# Patient Record
Sex: Female | Born: 1975 | Race: White | Hispanic: No | Marital: Married | Smoking: Never smoker
Health system: Southern US, Community
[De-identification: ages and names within clinical notes are randomized; demographics above are authoritative.]

## PROBLEM LIST (undated history)

## (undated) DIAGNOSIS — R12 Heartburn: Secondary | ICD-10-CM

## (undated) DIAGNOSIS — O26899 Other specified pregnancy related conditions, unspecified trimester: Secondary | ICD-10-CM

## (undated) HISTORY — PX: WISDOM TOOTH EXTRACTION: SHX21

## (undated) HISTORY — PX: ORIF ACETABULAR FRACTURE: SHX5029

## (undated) HISTORY — PX: OTHER SURGICAL HISTORY: SHX169

---

## 2008-03-21 ENCOUNTER — Other Ambulatory Visit: Admission: RE | Admit: 2008-03-21 | Discharge: 2008-03-21 | Payer: Self-pay | Admitting: Family Medicine

## 2009-03-13 ENCOUNTER — Ambulatory Visit: Payer: Self-pay | Admitting: Vascular Surgery

## 2009-03-13 ENCOUNTER — Ambulatory Visit: Admission: RE | Admit: 2009-03-13 | Discharge: 2009-03-13 | Payer: Self-pay | Admitting: Family Medicine

## 2009-03-13 ENCOUNTER — Encounter (INDEPENDENT_AMBULATORY_CARE_PROVIDER_SITE_OTHER): Payer: Self-pay | Admitting: Family Medicine

## 2009-03-28 ENCOUNTER — Ambulatory Visit: Payer: Self-pay | Admitting: Sports Medicine

## 2009-03-28 DIAGNOSIS — M629 Disorder of muscle, unspecified: Secondary | ICD-10-CM

## 2009-03-28 DIAGNOSIS — M216X9 Other acquired deformities of unspecified foot: Secondary | ICD-10-CM

## 2009-03-28 DIAGNOSIS — M25559 Pain in unspecified hip: Secondary | ICD-10-CM

## 2009-03-28 DIAGNOSIS — M76899 Other specified enthesopathies of unspecified lower limb, excluding foot: Secondary | ICD-10-CM

## 2009-12-15 ENCOUNTER — Emergency Department (HOSPITAL_COMMUNITY): Admission: EM | Admit: 2009-12-15 | Discharge: 2009-12-15 | Payer: Self-pay | Admitting: Family Medicine

## 2010-04-10 ENCOUNTER — Ambulatory Visit: Payer: Self-pay | Admitting: Family Medicine

## 2010-04-10 DIAGNOSIS — M722 Plantar fascial fibromatosis: Secondary | ICD-10-CM

## 2010-06-15 ENCOUNTER — Other Ambulatory Visit: Admission: RE | Admit: 2010-06-15 | Discharge: 2010-06-15 | Payer: Self-pay | Admitting: Family Medicine

## 2010-12-08 NOTE — Assessment & Plan Note (Signed)
Summary: HEEL PAIN,RUNNING MARATHON SAT,MC   Vital Signs:  Patient profile:   35 year old female Height:      63 inches Weight:      126 pounds BMI:     22.40 BP sitting:   128 / 70  Vitals Entered By: Lillia Pauls CMA (April 10, 2010 9:12 AM)  History of Present Illness: Several days of increasing Right heel pain. Leaves this pm to run marathon tomorrow.  Got new shoes of a different type for this race. had done well with them  until last week or so when she developed pain in right plantar heel.Worse first step of am, sore, 4-6/10. Better with ice. Wants an injection  as she has trained hard for this Marathon and feels she will likely not be able to run without treatment.  Has had no prior problems with plantar fasciitis of either foot. New shoe style her only change  Allergies (verified): 1)  ! * Decadron  Review of Systems       see hpi  Physical Exam  Msk:  Right plantar fascia tender at origin and palp[ation here reproduces her pain. Foot is otherwise non tender. Slight pes planus B. Additional Exam:  Patient given informed consent for injection. Discussed possible complications of infection, bleeding or skin atrophy at site of injection. Possible side effect of avascular necrosis (focal area of bone death) due to steroid use.Appropriate verbal time out taken Are cleaned and prepped in usual sterile fashion. A -1/2--- cc kennalog plus ---1-cc 1% lidocaine without epinephrine was injected into the-area above the right plantar fascia using the lateral approach--. Patient tolerated procedure well with no complications.    Impression & Recommendations:  Problem # 1:  PLANTAR FASCIITIS, RIGHT (ICD-728.71)  acute--probably due to shoe change while traininng for marathon. We discussed options--I recommend return to her previous shoe for the race. Pros and cons of injection therapy at this time (right before race) vs conservative therapy were discussed at length. Ultimately she chose  injection. RTC as needed, especially if she continues to have problems, we could consider orthotics.  Orders: Joint Aspirate / Injection, Intermediate (99371) Kenalog 10 mg inj (J3301)

## 2012-07-25 ENCOUNTER — Other Ambulatory Visit: Payer: Self-pay | Admitting: Family Medicine

## 2012-07-25 DIAGNOSIS — N6452 Nipple discharge: Secondary | ICD-10-CM

## 2012-07-28 ENCOUNTER — Ambulatory Visit
Admission: RE | Admit: 2012-07-28 | Discharge: 2012-07-28 | Disposition: A | Discharge status: Home / Self Care | Payer: 59 | Source: Ambulatory Visit | Attending: Family Medicine | Admitting: Family Medicine

## 2012-07-28 DIAGNOSIS — N6452 Nipple discharge: Secondary | ICD-10-CM

## 2012-09-08 ENCOUNTER — Emergency Department (HOSPITAL_COMMUNITY)
Admission: EM | Admit: 2012-09-08 | Discharge: 2012-09-08 | Disposition: A | Discharge status: Nursing Home (Includes ICF) | Payer: 59 | Source: Home / Self Care | Attending: Emergency Medicine | Admitting: Emergency Medicine

## 2012-09-08 ENCOUNTER — Emergency Department (HOSPITAL_COMMUNITY)
Admission: EM | Admit: 2012-09-08 | Discharge: 2012-09-09 | Disposition: A | Discharge status: Home / Self Care | Payer: 59 | Attending: Emergency Medicine | Admitting: Emergency Medicine

## 2012-09-08 ENCOUNTER — Encounter (HOSPITAL_COMMUNITY): Payer: Self-pay | Admitting: Emergency Medicine

## 2012-09-08 ENCOUNTER — Encounter (HOSPITAL_COMMUNITY): Payer: Self-pay

## 2012-09-08 ENCOUNTER — Emergency Department (HOSPITAL_COMMUNITY): Payer: 59

## 2012-09-08 DIAGNOSIS — R109 Unspecified abdominal pain: Secondary | ICD-10-CM | POA: Insufficient documentation

## 2012-09-08 DIAGNOSIS — K3580 Unspecified acute appendicitis: Secondary | ICD-10-CM

## 2012-09-08 LAB — HEPATIC FUNCTION PANEL
ALT: 25 U/L (ref 0–35)
AST: 28 U/L (ref 0–37)
Albumin: 3.8 g/dL (ref 3.5–5.2)
Alkaline Phosphatase: 53 U/L (ref 39–117)
Bilirubin, Direct: 0.1 mg/dL (ref 0.0–0.3)
Indirect Bilirubin: 0.4 mg/dL (ref 0.3–0.9)
Total Bilirubin: 0.5 mg/dL (ref 0.3–1.2)
Total Protein: 6.3 g/dL (ref 6.0–8.3)

## 2012-09-08 LAB — CBC WITH DIFFERENTIAL/PLATELET
Basophils Absolute: 0 K/uL (ref 0.0–0.1)
Basophils Relative: 0 % (ref 0–1)
Eosinophils Absolute: 0.2 K/uL (ref 0.0–0.7)
Eosinophils Relative: 3 % (ref 0–5)
HCT: 35.9 % — ABNORMAL LOW (ref 36.0–46.0)
Hemoglobin: 12.5 g/dL (ref 12.0–15.0)
Lymphocytes Relative: 38 % (ref 12–46)
Lymphs Abs: 2.7 K/uL (ref 0.7–4.0)
MCH: 29 pg (ref 26.0–34.0)
MCHC: 34.8 g/dL (ref 30.0–36.0)
MCV: 83.3 fL (ref 78.0–100.0)
Monocytes Absolute: 0.7 K/uL (ref 0.1–1.0)
Monocytes Relative: 9 % (ref 3–12)
Neutro Abs: 3.5 K/uL (ref 1.7–7.7)
Neutrophils Relative %: 50 % (ref 43–77)
Platelets: 187 K/uL (ref 150–400)
RBC: 4.31 MIL/uL (ref 3.87–5.11)
RDW: 12.6 % (ref 11.5–15.5)
WBC: 7.1 K/uL (ref 4.0–10.5)

## 2012-09-08 LAB — BASIC METABOLIC PANEL
BUN: 18 mg/dL (ref 6–23)
CO2: 21 mEq/L (ref 19–32)
Calcium: 8.4 mg/dL (ref 8.4–10.5)
Creatinine, Ser: 0.8 mg/dL (ref 0.50–1.10)
GFR calc non Af Amer: >90 mL/min (ref >90)
Glucose, Bld: 96 mg/dL (ref 70–99)
Sodium: 138 mEq/L (ref 135–145)

## 2012-09-08 LAB — POCT URINALYSIS DIP (DEVICE)
Hgb urine dipstick: NEGATIVE
Nitrite: NEGATIVE
Protein, ur: NEGATIVE mg/dL
Specific Gravity, Urine: 1.015 (ref 1.005–1.030)
Urobilinogen, UA: 0.2 mg/dL (ref 0.0–1.0)

## 2012-09-08 LAB — LIPASE, BLOOD: Lipase: 33 U/L (ref 11–59)

## 2012-09-08 LAB — WET PREP, GENITAL: Trich, Wet Prep: NONE SEEN

## 2012-09-08 MED ORDER — HYDROMORPHONE HCL PF 1 MG/ML IJ SOLN
1.0000 mg | Freq: Once | INTRAMUSCULAR | Status: AC
  Administered 2012-09-08: 1 mg via INTRAVENOUS
  Filled 2012-09-08: qty 1

## 2012-09-08 MED ORDER — ONDANSETRON HCL 4 MG/2ML IJ SOLN
INTRAMUSCULAR | Status: AC
  Filled 2012-09-08: qty 2

## 2012-09-08 MED ORDER — SODIUM CHLORIDE 0.9 % IV SOLN
INTRAVENOUS | Status: DC
  Administered 2012-09-08: 22:00:00 via INTRAVENOUS

## 2012-09-08 MED ORDER — HYDROMORPHONE HCL PF 1 MG/ML IJ SOLN
INTRAMUSCULAR | Status: AC
  Filled 2012-09-08: qty 1

## 2012-09-08 MED ORDER — SODIUM CHLORIDE 0.9 % IV SOLN
INTRAVENOUS | Status: DC
  Administered 2012-09-08: 20:00:00 via INTRAVENOUS

## 2012-09-08 MED ORDER — HYDROMORPHONE HCL PF 1 MG/ML IJ SOLN
1.0000 mg | Freq: Once | INTRAMUSCULAR | Status: AC
  Administered 2012-09-08: 1 mg via INTRAVENOUS

## 2012-09-08 MED ORDER — SODIUM CHLORIDE 0.9 % IV BOLUS (SEPSIS)
500.0000 mL | Freq: Once | INTRAVENOUS | Status: AC
  Administered 2012-09-09: 500 mL via INTRAVENOUS

## 2012-09-08 MED ORDER — ONDANSETRON HCL 4 MG/2ML IJ SOLN
4.0000 mg | Freq: Once | INTRAMUSCULAR | Status: AC
  Administered 2012-09-08: 4 mg via INTRAVENOUS

## 2012-09-08 NOTE — ED Provider Notes (Signed)
History     CSN: [id number]  Arrival date & time 09/08/12  2027   First MD Initiated Contact with Patient 09/08/12 2049      Chief Complaint  Patient presents with  . 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 Patient reports while walking today she developed acute onset of pain to her mid abdomen. Describe pain as a sharp and throbbing sensation that gets progressively severe throughout the day. She took ibuprofen 800mg  without relief.  Onset is acute, persistent, moderate in severity, radiates to the right lower abdomen She felt nauseated without vomiting or diarrhea. Denies fever, chills, headache, chest pain, short of breath, cough, back pain, urinary symptoms, or rash. Her last menstrual period was a month ago and she is having some spotting. However, patient does not menstrual cramp she normally does not have menstrual cramp. She denies any vaginal discharge or dysuria. Patient was initially seen at the urgent care clinic, was sent to ER for further evaluation for possible appendicitis. Patient reports she is feeling much better after receiving 1 mg of Dilaudid and Zofran. No prior history of abdominal surgery.  No past medical history on file.  No past surgical history on file.  No family history on file.  History  Substance Use Topics  . Smoking status: Never Smoker   . Smokeless tobacco: Not on file  . Alcohol Use: Yes    OB History    Grav Para Term Preterm Abortions TAB SAB Ect Mult Living                  Review of Systems  All other systems reviewed and are negative.    Allergies  Review of patient's allergies indicates no known allergies.  Home Medications  No current outpatient prescriptions on file.  BP 154/89  Pulse 46  Temp 98.2 F (36.8 C) (Oral)  Resp 20  SpO2 100%  LMP  09/08/2012  Physical Exam  Nursing note and vitals reviewed. Constitutional: She is oriented to person, place, and time. She appears well-developed and well-nourished. No distress.       Awake, alert, nontoxic appearance  HENT:  Head: Atraumatic.  Eyes: Conjunctivae normal are normal. Right eye exhibits no discharge. Left eye exhibits no discharge.  Neck: Neck supple.  Cardiovascular: Normal rate and regular rhythm.   Pulmonary/Chest: Effort normal. No respiratory distress. She exhibits no tenderness.  Abdominal: Soft. There is tenderness (Tenderness to periumbilical region on palpation without guarding or rebound tenderness. No CVA tenderness. No hernia noted. No overlying skin changes. Negative psoas or obturator sign.). There is no rebound and no guarding.  Musculoskeletal: She exhibits no edema and no tenderness.       ROM appears intact, no obvious focal weakness  Neurological: She is alert and oriented to person, place, and time.       Mental status and motor strength appears intact  Skin: No rash noted.  Psychiatric: She has a normal mood and affect.    ED Course  Procedures (including critical care time)  Results for orders placed during the hospital encounter of 09/08/12  CBC WITH DIFFERENTIAL      Component Value Range   WBC 7.1  4.0 - 10.5 K/uL   RBC 4.31  3.87 - 5.11 MIL/uL   Hemoglobin 12.5  12.0 - 15.0 g/dL   HCT 40.9 (*) 81.1 - 91.4 %   MCV 83.3  78.0 -  100.0 fL   MCH 29.0  26.0 - 34.0 pg   MCHC 34.8  30.0 - 36.0 g/dL   RDW 16.1  09.6 - 04.5 %   Platelets 187  150 - 400 K/uL   Neutrophils Relative 50  43 - 77 %   Neutro Abs 3.5  1.7 - 7.7 K/uL   Lymphocytes Relative 38  12 - 46 %   Lymphs Abs 2.7  0.7 - 4.0 K/uL   Monocytes Relative 9  3 - 12 %   Monocytes Absolute 0.7  0.1 - 1.0 K/uL   Eosinophils Relative 3  0 - 5 %   Eosinophils Absolute 0.2  0.0 - 0.7 K/uL   Basophils Relative 0  0 - 1 %   Basophils Absolute 0.0  0.0 - 0.1 K/uL  BASIC METABOLIC PANEL       Component Value Range   Sodium 138  135 - 145 mEq/L   Potassium 3.6  3.5 - 5.1 mEq/L   Chloride 106  96 - 112 mEq/L   CO2 21  19 - 32 mEq/L   Glucose, Bld 96  70 - 99 mg/dL   BUN 18  6 - 23 mg/dL   Creatinine, Ser 4.09  0.50 - 1.10 mg/dL   Calcium 8.4  8.4 - 81.1 mg/dL   GFR calc non Af Amer >90  >90 mL/min   GFR calc Af Amer >90  >90 mL/min  HEPATIC FUNCTION PANEL      Component Value Range   Total Protein 6.3  6.0 - 8.3 g/dL   Albumin 3.8  3.5 - 5.2 g/dL   AST 28  0 - 37 U/L   ALT 25  0 - 35 U/L   Alkaline Phosphatase 53  39 - 117 U/L   Total Bilirubin 0.5  0.3 - 1.2 mg/dL   Bilirubin, Direct 0.1  0.0 - 0.3 mg/dL   Indirect Bilirubin 0.4  0.3 - 0.9 mg/dL  LIPASE, BLOOD      Component Value Range   Lipase 33  11 - 59 U/L   Ct Abdomen Pelvis W Contrast  09/09/2012  *RADIOLOGY REPORT*  Clinical Data: Right lower quadrant abdominal pain.  Mid abdominal pain.  Nausea.  CT ABDOMEN AND PELVIS WITH CONTRAST  Technique:  Multidetector CT imaging of the abdomen and pelvis was performed following the standard protocol during bolus administration of intravenous contrast.  Contrast: 80mL OMNIPAQUE IOHEXOL 300 MG/ML  SOLN  Comparison: None.  Findings: The lung bases are clear.  The liver, spleen, gallbladder, pancreas, adrenal glands, kidneys, abdominal aorta, and retroperitoneal lymph nodes are unremarkable. The stomach, small bowel, and colon are not abnormally distended. Diffusely stool filled colon.  No free air or free fluid in the abdomen.  Pelvis:  The uterus and ovaries are not abnormally enlarged. Bladder wall is not thickened.  No free or loculated pelvic fluid collections.  The appendix is not identified.  No diverticulitis. Normal alignment of the lumbar vertebrae.  IMPRESSION: No acute process demonstrated in the abdomen or pelvis.   Original Report Authenticated By: Burman Nieves, M.D.     1. Abdominal pain  MDM  Pt with acute onset of periumbilical tenderness and finding  suggestive of appendicitis. Pt is minimally tender to her periumbilical region however she has been given pain medication.  Pt has a [facility] exam [facility] today but denies any significant discomfort with pelvic exam.  Her wet prep does shows moderate WBC.  Pt acknowledge she normally  has low heart rate.  Will check CBC/BMP and will consider CT scan if she has elevated WBC.  Pt current in NAD.     10:06 PM Pt prefers CT scan to r/o appy.  My attending has evaluated pt and agrees.  CT scan ordered.  Will move to CDU, report given to CDU PA by my attending.     12:48 AM Patient currently rating her pain as a 4/10. Abdominal CT scans does not show evidence of any acute finding, however and appendix was  not visualized. I discussed the time of my attending. Plan to have patient return in 12 hours for serial examination. Patient voiced understanding and agrees with plan. Patient does have a primary care Dr., Dr. Yehuda Mao.  Recommend return sooner if the pain worsened. I offered pain medication, patient declined. Patient requests to be discharged.    BP 111/77  Pulse 41  Temp 98.2 F (36.8 C) (Oral)  Resp 18  SpO2 93%  LMP 09/08/2012  I have reviewed nursing notes and vital signs. I personally reviewed the imaging tests through PACS system  I reviewed available ER/hospitalization records thought the EMR   Fayrene Helper, [place] 09/09/12 1610

## 2012-09-08 NOTE — ED Notes (Signed)
Spoke with [facility] to give report about patient for transfer.

## 2012-09-08 NOTE — ED Notes (Signed)
Pt presents from urgent care with RLQ abd pain.  Pt states started this morning without getting better.  Went to urgent care and was seen there and was transferred here.  Pt was given dilaudid 1mg  and Zofran 4mg .  Pt a/o at this time.

## 2012-09-08 NOTE — ED Provider Notes (Signed)
Medical screening examination/treatment/procedure(s) were conducted as a shared visit with non-physician practitioner(s) and myself.  I personally evaluated the patient during the encounter.  This 36 year old female has almost 12 hours of gradual onset. Local pain now radiating somewhat towards the right lower quadrant with nausea without vomiting, without vaginal discharge, without pelvic pain or tenderness on her examination at the urgent care Center prior to arrival to the ED today, she has mild periumbilical and right lower quadrant tenderness now without rebound, she prefers CT tonight after options discussed, plan move to CDU. [id number]  Hurman Horn, MD 09/10/12 [phone]

## 2012-09-08 NOTE — ED Notes (Signed)
Reports abd pain which started today.    Tried Motrin 800 mg and heat but no relief.   Reports spotting in the vagina

## 2012-09-08 NOTE — ED Provider Notes (Signed)
No chief complaint on file.   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 It became more severe around 3 and now is rated 10 over 10 in intensity. The pain has doubled her over and has caused nausea but no vomiting. She denies any fever or chills. No constipation, diarrhea, blood in stool. No urinary symptoms. No GYN complaints. She had a little bit of spotting today but her menses are due to come on today. She has been trying to get pregnant. She has no other pregnancy symptoms. She has had no prior history of similar pains in the past. She still has her appendix.  Review of Systems:  Other than noted above, the patient denies any of the following symptoms: Constitutional:  No fever, chills, fatigue, weight loss or anorexia. Lungs:  No cough or shortness of breath. Heart:  No chest pain, palpitations, syncope or edema.  No cardiac history. Abdomen:  No nausea, vomiting, hematememesis, melena, diarrhea, or hematochezia. GU:  No dysuria, frequency, urgency, or hematuria. Gyn:  No vaginal discharge, itching, abnormal bleeding, dyspareunia, or pelvic pain.  PMFSH:  Past medical history, family history, social history, meds, and allergies were reviewed along with nurse's notes.  No prior abdominal surgeries, past history of GI problems, STDs or GYN problems.  No history of aspirin or NSAID use.  No excessive alcohol intake.  Physical Exam:   Vital signs:  BP 163/93  Pulse 50  Temp 97.8 F (36.6 C) (Oral)  Resp 16  SpO2 100% Gen:  Alert, oriented, in severe distress due to abdominal pain. Lungs:  Breath sounds clear and equal bilaterally.  No wheezes, rales or rhonchi. Heart:  Regular rhythm.  No gallops or murmers.   Abdomen:   Flat and nondistended. She has marked at tenderness to palpation in the periumbilical area, right flank, and right lower cautery and with guarding and  rebound. Bowel sounds are present but are diminished. There no masses. Pelvic:  Normal external genitalia, there is a small amount of blood in the vaginal vault, vaginal and cervical mucosa were otherwise normal. No cervical motion tenderness. Uterus is mid position, normal in size and shape, no adnexal masses or tenderness. Skin:  Clear, warm and dry.  No rash.  Labs:   Results for orders placed during the hospital encounter of 09/08/12  POCT URINALYSIS DIP (DEVICE)      Component Value Range   Glucose, UA NEGATIVE  NEGATIVE mg/dL   Bilirubin Urine NEGATIVE  NEGATIVE   Ketones, ur NEGATIVE  NEGATIVE mg/dL   Specific Gravity, Urine 1.015  1.005 - 1.030   Hgb urine dipstick NEGATIVE  NEGATIVE   pH 8.5 (*) 5.0 - 8.0   Protein, ur NEGATIVE  NEGATIVE mg/dL   Urobilinogen, UA 0.2  0.0 - 1.0 mg/dL   Nitrite NEGATIVE  NEGATIVE   Leukocytes, UA NEGATIVE  NEGATIVE  POCT PREGNANCY, URINE      Component Value Range   Preg Test, Ur NEGATIVE  NEGATIVE     Course in Urgent Care Center:   Since she was in such severe pain, she was given Dilaudid 1 mg IV and Zofran 4 mg IV. Normal saline was started at 50 mL per hour.  Assessment:  The encounter diagnosis was Acute appendicitis.  Plan:   1.  The following meds were prescribed:   New Prescriptions   No medications on file  2.  The patient was transferred to the emergency department via [facility] in stable condition.   Reuben Likes, MD 09/08/12 2000

## 2012-09-09 ENCOUNTER — Encounter (HOSPITAL_COMMUNITY): Payer: Self-pay | Admitting: Radiology

## 2012-09-09 LAB — GC/CHLAMYDIA PROBE AMP, GENITAL: GC Probe Amp, Genital: NEGATIVE

## 2012-09-09 MED ORDER — IOHEXOL 300 MG/ML  SOLN
80.0000 mL | Freq: Once | INTRAMUSCULAR | Status: AC | PRN
  Administered 2012-09-09: 80 mL via INTRAVENOUS

## 2012-09-09 MED ORDER — OXYCODONE-ACETAMINOPHEN 5-325 MG PO TABS: 1.0000 | ORAL_TABLET | Freq: Four times a day (QID) | ORAL | Status: DC | PRN

## 2012-09-09 MED ORDER — ONDANSETRON HCL 4 MG PO TABS: 4.0000 mg | ORAL_TABLET | Freq: Four times a day (QID) | ORAL | Status: DC

## 2012-09-09 NOTE — ED Notes (Signed)
Pt for discharge.Vital signs stable and GCS 15 

## 2012-09-10 NOTE — ED Provider Notes (Signed)
Medical screening examination/treatment/procedure(s) were conducted as a shared visit with non-physician practitioner(s) and myself.  I personally evaluated the patient during the encounter  Hurman Horn, MD 09/10/12 1649

## 2012-09-11 NOTE — ED Notes (Signed)
GC,chlamydia negative; wet prep shows clue cells TNTC and few WBC's; discussed w Dr Lorenz Coaster, no need for further tx

## 2013-02-19 ENCOUNTER — Other Ambulatory Visit (HOSPITAL_COMMUNITY): Payer: Self-pay | Admitting: Obstetrics and Gynecology

## 2013-02-19 DIAGNOSIS — Z3149 Encounter for other procreative investigation and testing: Secondary | ICD-10-CM

## 2013-02-28 ENCOUNTER — Ambulatory Visit (HOSPITAL_COMMUNITY)
Admission: RE | Admit: 2013-02-28 | Discharge: 2013-02-28 | Disposition: A | Discharge status: Home / Self Care | Payer: 59 | Source: Ambulatory Visit | Attending: Obstetrics and Gynecology | Admitting: Obstetrics and Gynecology

## 2013-02-28 DIAGNOSIS — Z3149 Encounter for other procreative investigation and testing: Secondary | ICD-10-CM

## 2013-02-28 DIAGNOSIS — N979 Female infertility, unspecified: Secondary | ICD-10-CM | POA: Insufficient documentation

## 2013-02-28 MED ORDER — IOHEXOL 300 MG/ML  SOLN
20.0000 mL | Freq: Once | INTRAMUSCULAR | Status: AC | PRN
  Administered 2013-02-28: 8 mL

## 2013-12-07 LAB — OB RESULTS CONSOLE ABO/RH: RH Type: POSITIVE

## 2013-12-07 LAB — OB RESULTS CONSOLE HEPATITIS B SURFACE ANTIGEN: HEP B S AG: NEGATIVE

## 2013-12-07 LAB — OB RESULTS CONSOLE RPR: RPR: NONREACTIVE

## 2013-12-07 LAB — OB RESULTS CONSOLE HIV ANTIBODY (ROUTINE TESTING): HIV: NONREACTIVE

## 2014-02-21 ENCOUNTER — Other Ambulatory Visit: Payer: Self-pay | Admitting: Obstetrics and Gynecology

## 2014-02-21 MED ORDER — HYDROXYPROGESTERONE CAPROATE 250 MG/ML IM OIL: 250.0000 mg | TOPICAL_OIL | Freq: Once | INTRAMUSCULAR | Status: DC

## 2014-02-22 MED ORDER — HYDROXYPROGESTERONE CAPROATE 250 MG/ML IM OIL: 250.0000 mg | TOPICAL_OIL | Freq: Once | INTRAMUSCULAR | Status: DC

## 2014-03-15 MED ORDER — HYDROXYPROGESTERONE CAPROATE 250 MG/ML IM OIL: 250.0000 mg | TOPICAL_OIL | Freq: Once | INTRAMUSCULAR | Status: DC

## 2014-06-26 IMAGING — CT CT ABD-PELV W/ CM
2 of 4 series · 14 of 32 positions shown, 19 images · IV contrast (omnipaque)
Comparison: None.

CLINICAL DATA: Right lower quadrant abdominal pain.  Mid abdominal
pain.  Nausea.

CT ABDOMEN AND PELVIS WITH CONTRAST
TECHNIQUE: Multidetector CT imaging of the abdomen and pelvis was
performed following the standard protocol during bolus
administration of intravenous contrast.
Contrast: 80mL OMNIPAQUE IOHEXOL 300 MG/ML  SOLN

[Series 2: routine abdomen · axial · 0.70mm/px · z∈[-375,-80]mm · 6 of 83 slices shown, 11 images]
[im 12/83  soft-tissue]
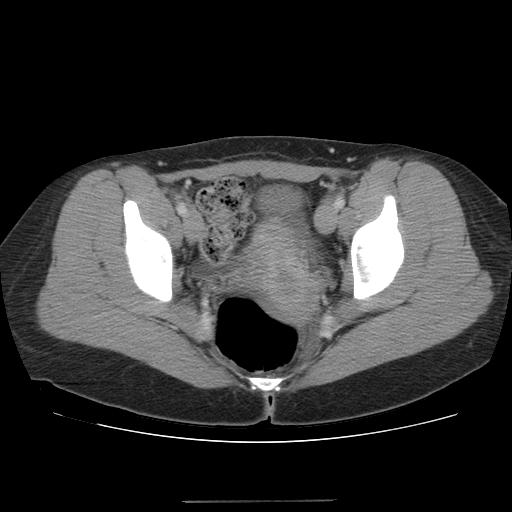
[im 12/83  bone]
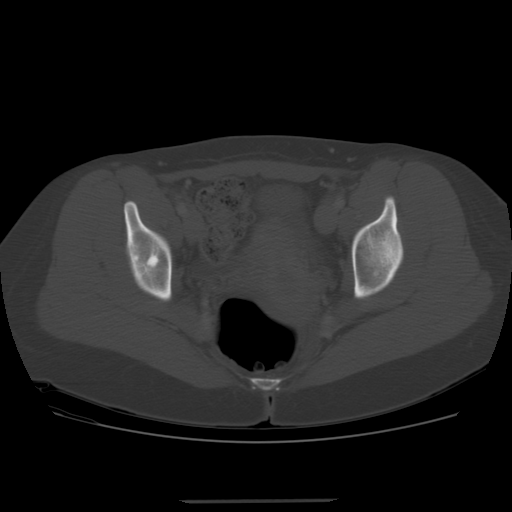
[im 24/83  soft-tissue]
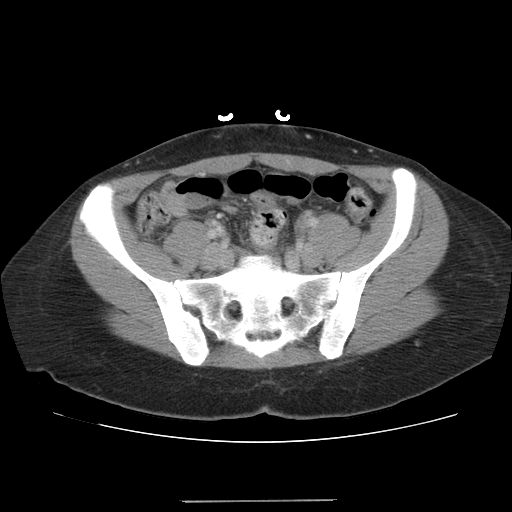
[im 36/83  soft-tissue]
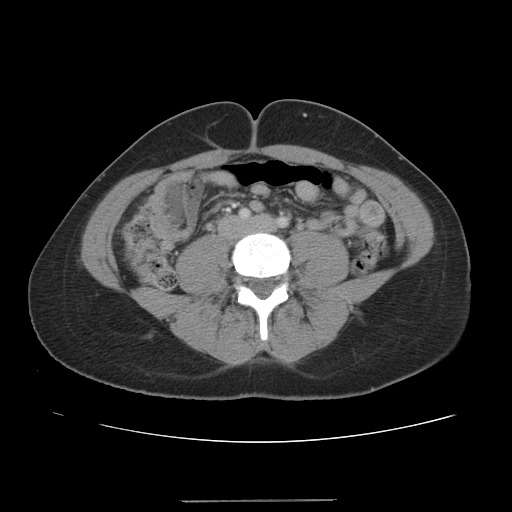
[im 36/83  lung]
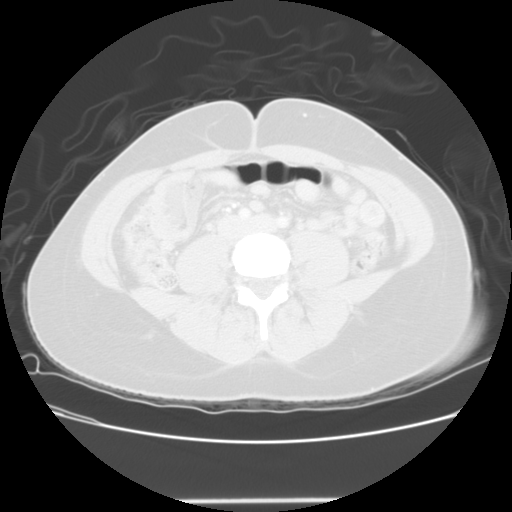
[im 47/83  soft-tissue]
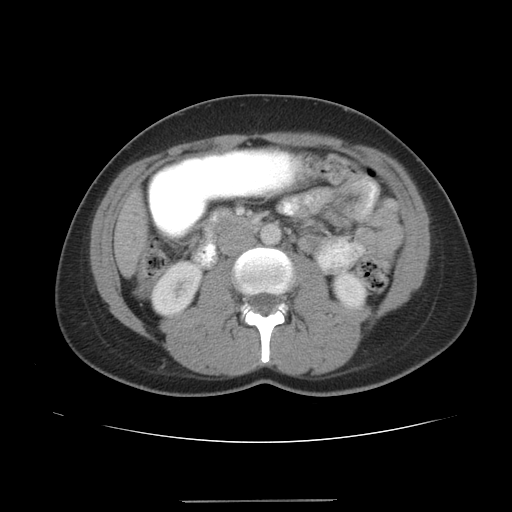
[im 47/83  lung]
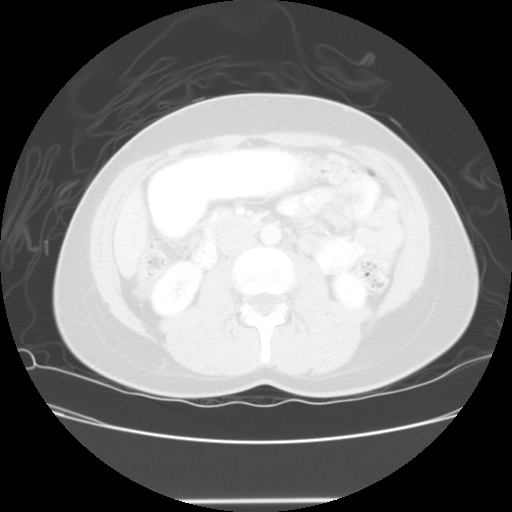
[im 59/83  soft-tissue]
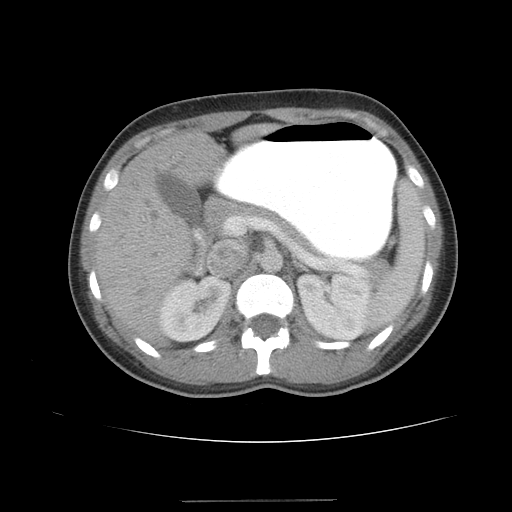
[im 59/83  lung]
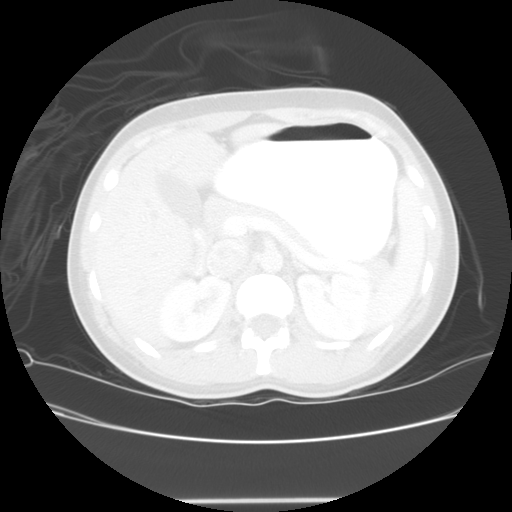
[im 71/83  soft-tissue]
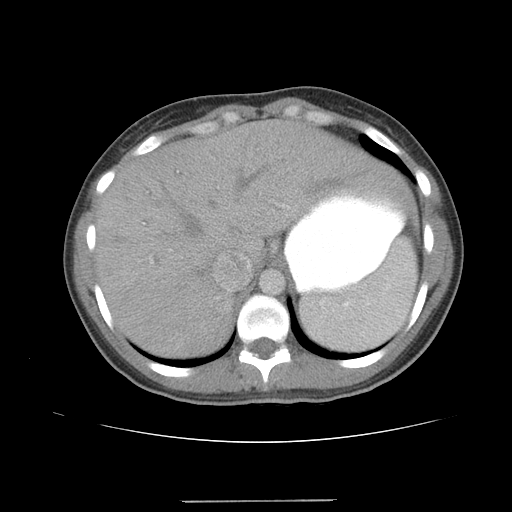
[im 71/83  lung]
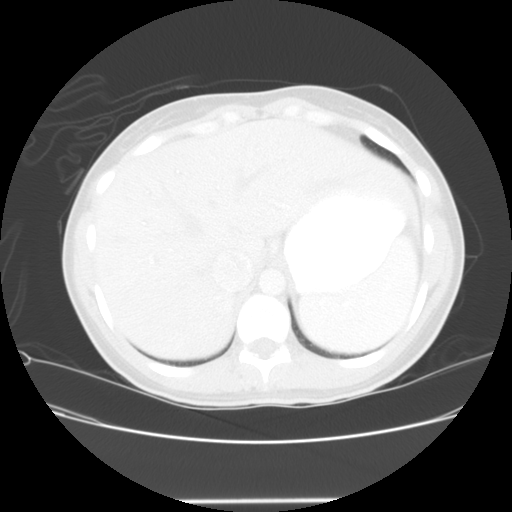

[Series 400: sag · sagittal · 0.93mm/px · 8 of 112 slices shown]
[im 11/112  soft-tissue]
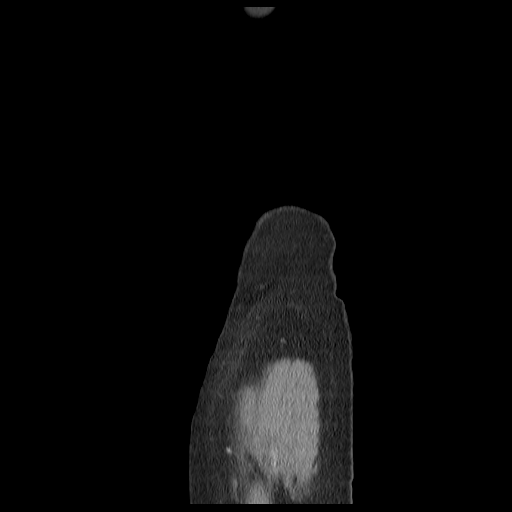
[im 21/112  soft-tissue]
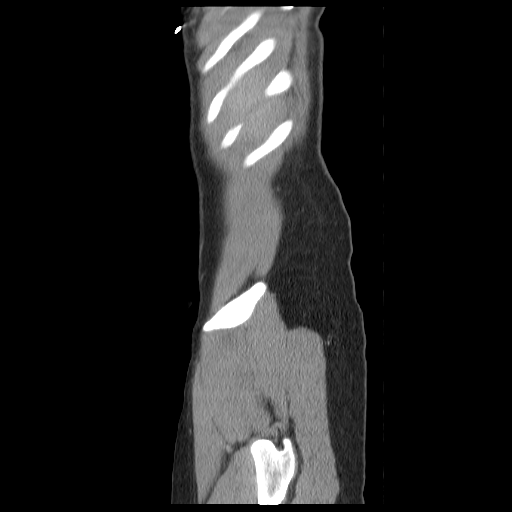
[im 41/112  soft-tissue]
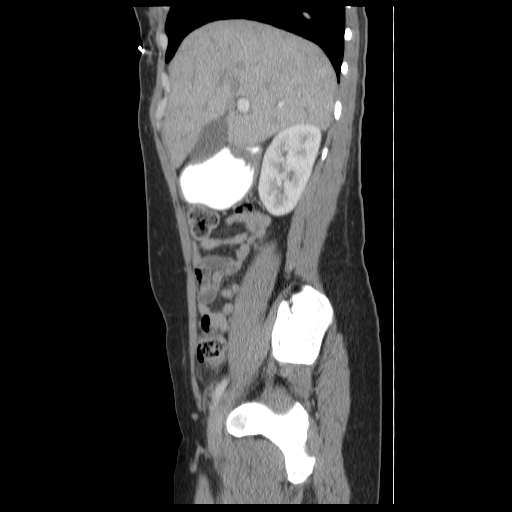
[im 51/112  soft-tissue]
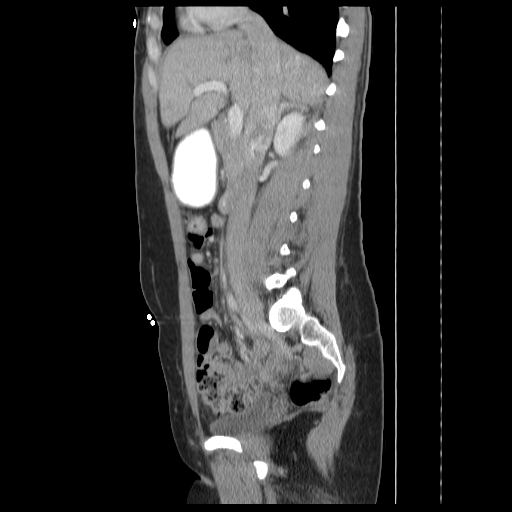
[im 61/112  soft-tissue]
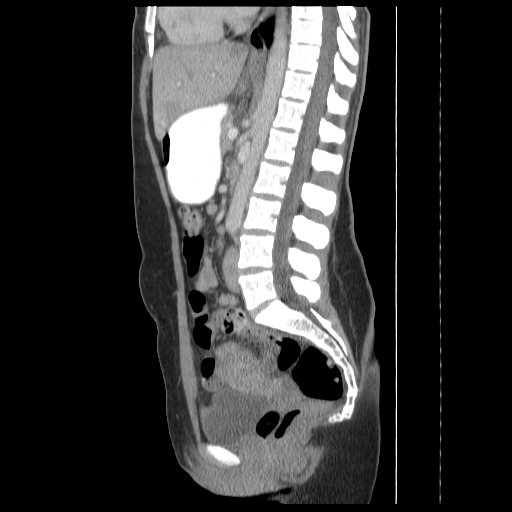
[im 71/112  soft-tissue]
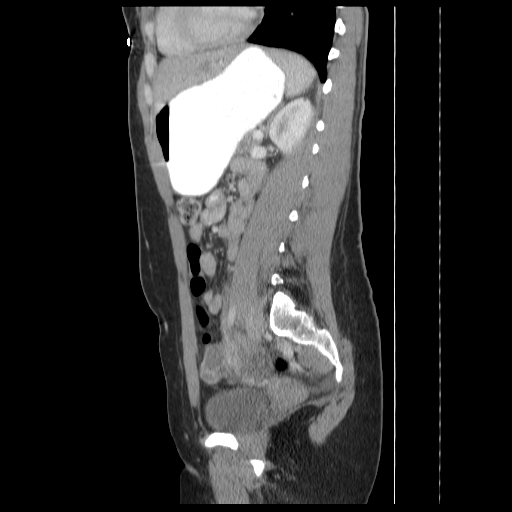
[im 91/112  soft-tissue]
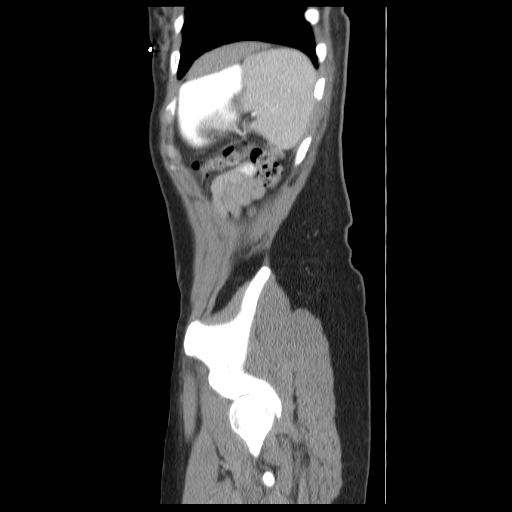
[im 101/112  soft-tissue]
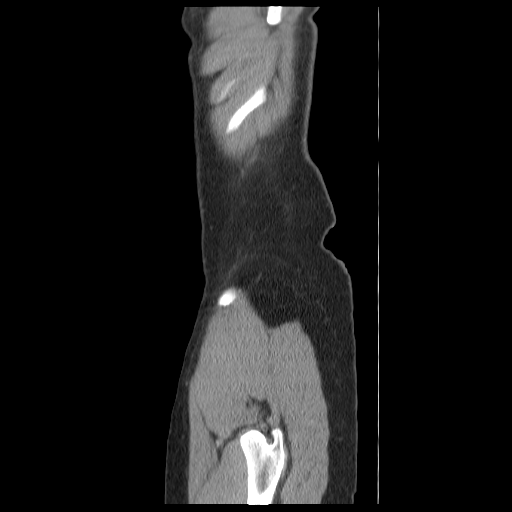

[14 of 32 positions shown; findings below may reference images not displayed]

FINDINGS: The lung bases are clear.

The liver, spleen, gallbladder, pancreas, adrenal glands, kidneys,
abdominal aorta, and retroperitoneal lymph nodes are unremarkable.
The stomach, small bowel, and colon are not abnormally distended.
Diffusely stool filled colon.  No free air or free fluid in the
abdomen.

Pelvis:  The uterus and ovaries are not abnormally enlarged.
Bladder wall is not thickened.  No free or loculated pelvic fluid
collections.  The appendix is not identified.  No diverticulitis.
Normal alignment of the lumbar vertebrae.
IMPRESSION: No acute process demonstrated in the abdomen or pelvis.

## 2014-07-01 ENCOUNTER — Other Ambulatory Visit: Payer: Self-pay | Admitting: Obstetrics and Gynecology

## 2014-07-04 ENCOUNTER — Encounter (HOSPITAL_COMMUNITY)
Admission: RE | Admit: 2014-07-04 | Discharge: 2014-07-04 | Disposition: A | Discharge status: Home / Self Care | Payer: 59 | Source: Ambulatory Visit | Attending: Obstetrics and Gynecology | Admitting: Obstetrics and Gynecology

## 2014-07-04 ENCOUNTER — Encounter (HOSPITAL_COMMUNITY): Payer: Self-pay

## 2014-07-04 HISTORY — DX: Heartburn: R12

## 2014-07-04 HISTORY — DX: Other specified pregnancy related conditions, unspecified trimester: O26.899

## 2014-07-04 LAB — ABO/RH: ABO/RH(D): O POS

## 2014-07-04 LAB — TYPE AND SCREEN
ABO/RH(D): O POS
ANTIBODY SCREEN: NEGATIVE

## 2014-07-04 LAB — CBC
HCT: 37.6 % (ref 36.0–46.0)
Hemoglobin: 13.1 g/dL (ref 12.0–15.0)
MCH: 28.5 pg (ref 26.0–34.0)
MCHC: 34.8 g/dL (ref 30.0–36.0)
MCV: 81.9 fL (ref 78.0–100.0)
PLATELETS: 169 10*3/uL (ref 150–400)
RBC: 4.59 MIL/uL (ref 3.87–5.11)
RDW: 13.2 % (ref 11.5–15.5)
WBC: 10.2 10*3/uL (ref 4.0–10.5)

## 2014-07-04 NOTE — Patient Instructions (Addendum)
   Your procedure is scheduled on:  Saturday, August 29  Enter through the [employer] of [facility] at:  915 AM Pick up the phone at the desk and dial [phone] and inform us of your arrival.  Please call this number if you have any problems the morning of surgery: [phone]  Remember: Do not eat or drink after midnight:  Friday Take these medicines the morning of surgery with a SIP OF WATER: Nexium  Do not wear jewelry, make-up, or FINGER nail polish No metal in your hair or on your body. Do not wear lotions, powders, perfumes.  You may wear deodorant.  Do not bring valuables to the hospital. Contacts, dentures or bridgework may not be worn into surgery.  Leave suitcase in the car. After Surgery it may be brought to your room. For patients being admitted to the hospital, checkout time is 11:00am the day of discharge.  Home with husband Jill Alexanders cell [phone].

## 2014-07-05 LAB — RPR

## 2014-07-05 MED ORDER — CEFAZOLIN SODIUM-DEXTROSE 2-3 GM-% IV SOLR
2.0000 g | INTRAVENOUS | Status: AC
Start: 2014-07-06 — End: 2014-07-06
  Administered 2014-07-06: 2 g via INTRAVENOUS

## 2014-07-06 ENCOUNTER — Inpatient Hospital Stay (HOSPITAL_COMMUNITY): Payer: 59 | Admitting: Anesthesiology

## 2014-07-06 ENCOUNTER — Encounter (HOSPITAL_COMMUNITY)
Admission: AD | Disposition: A | Discharge status: Home / Self Care | Payer: Self-pay | Source: Ambulatory Visit | Attending: Obstetrics and Gynecology

## 2014-07-06 ENCOUNTER — Inpatient Hospital Stay (HOSPITAL_COMMUNITY)
Admission: AD | Admit: 2014-07-06 | Discharge: 2014-07-09 | DRG: 766 | Disposition: A | Discharge status: Home / Self Care | Payer: 59 | Source: Ambulatory Visit | Attending: Obstetrics and Gynecology | Admitting: Obstetrics and Gynecology

## 2014-07-06 ENCOUNTER — Encounter (HOSPITAL_COMMUNITY): Payer: Self-pay | Admitting: *Deleted

## 2014-07-06 ENCOUNTER — Encounter (HOSPITAL_COMMUNITY): Payer: 59 | Admitting: Anesthesiology

## 2014-07-06 DIAGNOSIS — O09529 Supervision of elderly multigravida, unspecified trimester: Secondary | ICD-10-CM | POA: Diagnosis present

## 2014-07-06 DIAGNOSIS — K219 Gastro-esophageal reflux disease without esophagitis: Secondary | ICD-10-CM | POA: Diagnosis present

## 2014-07-06 DIAGNOSIS — Z98891 History of uterine scar from previous surgery: Secondary | ICD-10-CM

## 2014-07-06 DIAGNOSIS — O34219 Maternal care for unspecified type scar from previous cesarean delivery: Secondary | ICD-10-CM | POA: Diagnosis present

## 2014-07-06 SURGERY — Surgical Case
Anesthesia: Spinal | Site: Abdomen

## 2014-07-06 MED ORDER — ONDANSETRON HCL 4 MG/2ML IJ SOLN
INTRAMUSCULAR | Status: DC | PRN
  Administered 2014-07-06: 4 mg via INTRAVENOUS

## 2014-07-06 MED ORDER — SIMETHICONE 80 MG PO CHEW
80.0000 mg | CHEWABLE_TABLET | Freq: Three times a day (TID) | ORAL | Status: DC
  Administered 2014-07-06 – 2014-07-09 (×8): 80 mg via ORAL
  Filled 2014-07-06 (×7): qty 1

## 2014-07-06 MED ORDER — BUPIVACAINE HCL (PF) 0.25 % IJ SOLN
INTRAMUSCULAR | Status: DC | PRN
  Administered 2014-07-06: 10 mL

## 2014-07-06 MED ORDER — LACTATED RINGERS IV BOLUS (SEPSIS)
300.0000 mL | Freq: Once | INTRAVENOUS | Status: AC
  Administered 2014-07-06: 300 mL via INTRAVENOUS

## 2014-07-06 MED ORDER — SODIUM CHLORIDE 0.9 % IJ SOLN: 3.0000 mL | INTRAMUSCULAR | Status: DC | PRN

## 2014-07-06 MED ORDER — SIMETHICONE 80 MG PO CHEW
80.0000 mg | CHEWABLE_TABLET | ORAL | Status: DC | PRN
  Filled 2014-07-06: qty 1

## 2014-07-06 MED ORDER — KETOROLAC TROMETHAMINE 60 MG/2ML IM SOLN
60.0000 mg | Freq: Once | INTRAMUSCULAR | Status: AC | PRN
  Administered 2014-07-06: 60 mg via INTRAMUSCULAR

## 2014-07-06 MED ORDER — NALBUPHINE HCL 10 MG/ML IJ SOLN
5.0000 mg | INTRAMUSCULAR | Status: DC | PRN
Start: 2014-07-06 — End: 2014-07-07

## 2014-07-06 MED ORDER — NALOXONE HCL 0.4 MG/ML IJ SOLN: 0.4000 mg | INTRAMUSCULAR | Status: DC | PRN

## 2014-07-06 MED ORDER — MEPERIDINE HCL 25 MG/ML IJ SOLN: 6.2500 mg | INTRAMUSCULAR | Status: DC | PRN

## 2014-07-06 MED ORDER — BUPIVACAINE HCL (PF) 0.25 % IJ SOLN
INTRAMUSCULAR | Status: AC
  Filled 2014-07-06: qty 30

## 2014-07-06 MED ORDER — ONDANSETRON HCL 4 MG/2ML IJ SOLN: 4.0000 mg | INTRAMUSCULAR | Status: DC | PRN

## 2014-07-06 MED ORDER — DIPHENHYDRAMINE HCL 50 MG/ML IJ SOLN: 25.0000 mg | INTRAMUSCULAR | Status: DC | PRN

## 2014-07-06 MED ORDER — SENNOSIDES-DOCUSATE SODIUM 8.6-50 MG PO TABS
2.0000 | ORAL_TABLET | ORAL | Status: DC
  Administered 2014-07-07 – 2014-07-08 (×2): 2 via ORAL
  Filled 2014-07-06 (×3): qty 2

## 2014-07-06 MED ORDER — DIBUCAINE 1 % RE OINT: 1.0000 "application " | TOPICAL_OINTMENT | RECTAL | Status: DC | PRN

## 2014-07-06 MED ORDER — NALOXONE HCL 1 MG/ML IJ SOLN: 1.0000 ug/kg/h | INTRAVENOUS | Status: DC | PRN

## 2014-07-06 MED ORDER — FENTANYL CITRATE 0.05 MG/ML IJ SOLN
INTRAMUSCULAR | Status: AC
  Filled 2014-07-06: qty 2

## 2014-07-06 MED ORDER — BUPIVACAINE IN DEXTROSE 0.75-8.25 % IT SOLN
INTRATHECAL | Status: DC | PRN
  Administered 2014-07-06: 1.4 mL via INTRATHECAL

## 2014-07-06 MED ORDER — TETANUS-DIPHTH-ACELL PERTUSSIS 5-2.5-18.5 LF-MCG/0.5 IM SUSP: 0.5000 mL | Freq: Once | INTRAMUSCULAR | Status: DC

## 2014-07-06 MED ORDER — OXYCODONE-ACETAMINOPHEN 5-325 MG PO TABS: 1.0000 | ORAL_TABLET | ORAL | Status: DC | PRN

## 2014-07-06 MED ORDER — NALBUPHINE HCL 10 MG/ML IJ SOLN: 5.0000 mg | INTRAMUSCULAR | Status: DC | PRN

## 2014-07-06 MED ORDER — KETOROLAC TROMETHAMINE 60 MG/2ML IM SOLN
INTRAMUSCULAR | Status: AC
  Filled 2014-07-06: qty 2

## 2014-07-06 MED ORDER — ONDANSETRON HCL 4 MG/2ML IJ SOLN
INTRAMUSCULAR | Status: AC
  Filled 2014-07-06: qty 2

## 2014-07-06 MED ORDER — SODIUM CHLORIDE 0.9 % IJ SOLN
INTRAMUSCULAR | Status: AC
  Filled 2014-07-06: qty 20

## 2014-07-06 MED ORDER — DIPHENHYDRAMINE HCL 25 MG PO CAPS: 25.0000 mg | ORAL_CAPSULE | Freq: Four times a day (QID) | ORAL | Status: DC | PRN

## 2014-07-06 MED ORDER — METHYLERGONOVINE MALEATE 0.2 MG/ML IJ SOLN: 0.2000 mg | INTRAMUSCULAR | Status: DC | PRN

## 2014-07-06 MED ORDER — DIPHENHYDRAMINE HCL 25 MG PO CAPS: 25.0000 mg | ORAL_CAPSULE | ORAL | Status: DC | PRN

## 2014-07-06 MED ORDER — PRENATAL MULTIVITAMIN CH
1.0000 | ORAL_TABLET | Freq: Every day | ORAL | Status: DC
  Filled 2014-07-06 (×2): qty 1

## 2014-07-06 MED ORDER — ONDANSETRON HCL 4 MG/2ML IJ SOLN: 4.0000 mg | Freq: Three times a day (TID) | INTRAMUSCULAR | Status: DC | PRN

## 2014-07-06 MED ORDER — LANOLIN HYDROUS EX OINT: 1.0000 "application " | TOPICAL_OINTMENT | CUTANEOUS | Status: DC | PRN

## 2014-07-06 MED ORDER — DIPHENHYDRAMINE HCL 50 MG/ML IJ SOLN: 12.5000 mg | INTRAMUSCULAR | Status: DC | PRN

## 2014-07-06 MED ORDER — CEFAZOLIN SODIUM-DEXTROSE 2-3 GM-% IV SOLR
INTRAVENOUS | Status: AC
  Filled 2014-07-06: qty 50

## 2014-07-06 MED ORDER — METHYLERGONOVINE MALEATE 0.2 MG PO TABS: 0.2000 mg | ORAL_TABLET | ORAL | Status: DC | PRN

## 2014-07-06 MED ORDER — SIMETHICONE 80 MG PO CHEW
80.0000 mg | CHEWABLE_TABLET | ORAL | Status: DC
  Filled 2014-07-06: qty 1

## 2014-07-06 MED ORDER — OXYTOCIN 10 UNIT/ML IJ SOLN
40.0000 [IU] | INTRAVENOUS | Status: DC | PRN
  Administered 2014-07-06: 40 [IU] via INTRAVENOUS

## 2014-07-06 MED ORDER — EPHEDRINE SULFATE 50 MG/ML IJ SOLN
INTRAMUSCULAR | Status: DC | PRN
  Administered 2014-07-06 (×2): 5 mg via INTRAVENOUS

## 2014-07-06 MED ORDER — LACTATED RINGERS IV SOLN: INTRAVENOUS | Status: DC

## 2014-07-06 MED ORDER — FENTANYL CITRATE 0.05 MG/ML IJ SOLN
INTRAMUSCULAR | Status: DC | PRN
  Administered 2014-07-06: 12.5 ug via INTRATHECAL

## 2014-07-06 MED ORDER — ONDANSETRON HCL 4 MG PO TABS: 4.0000 mg | ORAL_TABLET | ORAL | Status: DC | PRN

## 2014-07-06 MED ORDER — KETOROLAC TROMETHAMINE 30 MG/ML IJ SOLN: 30.0000 mg | Freq: Four times a day (QID) | INTRAMUSCULAR | Status: DC | PRN

## 2014-07-06 MED ORDER — MORPHINE SULFATE 0.5 MG/ML IJ SOLN
INTRAMUSCULAR | Status: AC
  Filled 2014-07-06: qty 10

## 2014-07-06 MED ORDER — SCOPOLAMINE 1 MG/3DAYS TD PT72
1.0000 | MEDICATED_PATCH | Freq: Once | TRANSDERMAL | Status: DC
  Administered 2014-07-06: 1.5 mg via TRANSDERMAL

## 2014-07-06 MED ORDER — ZOLPIDEM TARTRATE 5 MG PO TABS
5.0000 mg | ORAL_TABLET | Freq: Every evening | ORAL | Status: DC | PRN
Start: 2014-07-06 — End: 2014-07-07

## 2014-07-06 MED ORDER — LACTATED RINGERS IV SOLN
INTRAVENOUS | Status: DC
  Administered 2014-07-06: 12:00:00 via INTRAVENOUS
  Administered 2014-07-06 (×2): 125 mL/h via INTRAVENOUS

## 2014-07-06 MED ORDER — PHENYLEPHRINE 8 MG IN D5W 100 ML (0.08MG/ML) PREMIX OPTIME
INJECTION | INTRAVENOUS | Status: DC | PRN
  Administered 2014-07-06: 60 ug/min via INTRAVENOUS

## 2014-07-06 MED ORDER — MENTHOL 3 MG MT LOZG: 1.0000 | LOZENGE | OROMUCOSAL | Status: DC | PRN

## 2014-07-06 MED ORDER — METOCLOPRAMIDE HCL 5 MG/ML IJ SOLN: 10.0000 mg | Freq: Three times a day (TID) | INTRAMUSCULAR | Status: DC | PRN

## 2014-07-06 MED ORDER — FENTANYL CITRATE 0.05 MG/ML IJ SOLN: 25.0000 ug | INTRAMUSCULAR | Status: DC | PRN

## 2014-07-06 MED ORDER — OXYTOCIN 10 UNIT/ML IJ SOLN
INTRAMUSCULAR | Status: AC
  Filled 2014-07-06: qty 4

## 2014-07-06 MED ORDER — SCOPOLAMINE 1 MG/3DAYS TD PT72
MEDICATED_PATCH | TRANSDERMAL | Status: AC
  Administered 2014-07-06: 1.5 mg via TRANSDERMAL
  Filled 2014-07-06: qty 1

## 2014-07-06 MED ORDER — IBUPROFEN 600 MG PO TABS
600.0000 mg | ORAL_TABLET | Freq: Four times a day (QID) | ORAL | Status: DC
  Administered 2014-07-06 – 2014-07-07 (×2): 600 mg via ORAL
  Filled 2014-07-06: qty 1

## 2014-07-06 MED ORDER — MORPHINE SULFATE (PF) 0.5 MG/ML IJ SOLN
INTRAMUSCULAR | Status: DC | PRN
  Administered 2014-07-06: .1 mg via INTRATHECAL

## 2014-07-06 MED ORDER — PHENYLEPHRINE 8 MG IN D5W 100 ML (0.08MG/ML) PREMIX OPTIME
INJECTION | INTRAVENOUS | Status: AC
  Filled 2014-07-06: qty 100

## 2014-07-06 MED ORDER — WITCH HAZEL-GLYCERIN EX PADS: 1.0000 "application " | MEDICATED_PAD | CUTANEOUS | Status: DC | PRN

## 2014-07-06 MED ORDER — OXYTOCIN 40 UNITS IN LACTATED RINGERS INFUSION - SIMPLE MED: 62.5000 mL/h | INTRAVENOUS | Status: AC

## 2014-07-06 SURGICAL SUPPLY — 36 items
BLADE SURG 10 STRL SS (BLADE) ×6 IMPLANT
CLAMP CORD UMBIL (MISCELLANEOUS) IMPLANT
CLOTH BEACON ORANGE TIMEOUT ST (SAFETY) ×3 IMPLANT
CONTAINER PREFILL 10% NBF 15ML (MISCELLANEOUS) IMPLANT
DERMABOND ADVANCED (GAUZE/BANDAGES/DRESSINGS) ×2
DERMABOND ADVANCED .7 DNX12 (GAUZE/BANDAGES/DRESSINGS) ×1 IMPLANT
DRAPE LG THREE QUARTER DISP (DRAPES) IMPLANT
DRSG OPSITE POSTOP 4X10 (GAUZE/BANDAGES/DRESSINGS) ×3 IMPLANT
DURAPREP 26ML APPLICATOR (WOUND CARE) ×3 IMPLANT
ELECT REM PT RETURN 9FT ADLT (ELECTROSURGICAL) ×3
ELECTRODE REM PT RTRN 9FT ADLT (ELECTROSURGICAL) ×1 IMPLANT
EXTRACTOR VACUUM M CUP 4 TUBE (SUCTIONS) IMPLANT
EXTRACTOR VACUUM M CUP 4' TUBE (SUCTIONS)
GLOVE BIO SURGEON STRL SZ7.5 (GLOVE) ×3 IMPLANT
GOWN STRL REUS W/TWL LRG LVL3 (GOWN DISPOSABLE) ×6 IMPLANT
KIT ABG SYR 3ML LUER SLIP (SYRINGE) IMPLANT
NEEDLE HYPO 25X1 1.5 SAFETY (NEEDLE) ×3 IMPLANT
NEEDLE HYPO 25X5/8 SAFETYGLIDE (NEEDLE) IMPLANT
NEEDLE SPNL 20GX3.5 QUINCKE YW (NEEDLE) IMPLANT
NS IRRIG 1000ML POUR BTL (IV SOLUTION) ×3 IMPLANT
PACK C SECTION WH (CUSTOM PROCEDURE TRAY) ×3 IMPLANT
STAPLER VISISTAT 35W (STAPLE) IMPLANT
SUT MNCRL 0 VIOLET CTX 36 (SUTURE) ×2 IMPLANT
SUT MNCRL AB 3-0 PS2 27 (SUTURE) IMPLANT
SUT MON AB 2-0 CT1 27 (SUTURE) ×3 IMPLANT
SUT MON AB-0 CT1 36 (SUTURE) ×6 IMPLANT
SUT MONOCRYL 0 CTX 36 (SUTURE) ×4
SUT PLAIN 0 NONE (SUTURE) IMPLANT
SUT PLAIN 2 0 (SUTURE)
SUT PLAIN 2 0 XLH (SUTURE) IMPLANT
SUT PLAIN ABS 2-0 CT1 27XMFL (SUTURE) IMPLANT
SYRINGE 20CC LL (MISCELLANEOUS) IMPLANT
SYRINGE CONTROL L 12CC (SYRINGE) ×3 IMPLANT
TOWEL OR 17X24 6PK STRL BLUE (TOWEL DISPOSABLE) ×3 IMPLANT
TRAY FOLEY CATH 14FR (SET/KITS/TRAYS/PACK) ×3 IMPLANT
WATER STERILE IRR 1000ML POUR (IV SOLUTION) ×3 IMPLANT

## 2014-07-06 NOTE — Transfer of Care (Signed)
Immediate Anesthesia Transfer of Care Note  Patient: Kimberly Howe  Procedure(s) Performed: Procedure(s) with comments: Repeat CESAREAN SECTION (N/A) - EDD: 07/12/14  Patient Location: PACU  Anesthesia Type:Spinal  Level of Consciousness: awake and alert   Airway & Oxygen Therapy: Patient Spontanous Breathing  Post-op Assessment: Report given to PACU RN and Post -op Vital signs reviewed and stable  Post vital signs: Reviewed and stable  Complications: No apparent anesthesia complications

## 2014-07-06 NOTE — Progress Notes (Signed)
Patient ID: Kimberly Howe, female   DOB: [birth year], 38 y.o.   MRN: [id number] Patient seen and examined. Consent witnessed and signed. No changes noted. Update completed.

## 2014-07-06 NOTE — Anesthesia Preprocedure Evaluation (Signed)
Anesthesia Evaluation  Patient identified by MRN, date of birth, ID band Patient awake    Reviewed: Allergy & Precautions, H&P , NPO status , Patient's Chart, lab work & pertinent test results  Airway Mallampati: I TM Distance: >3 FB Neck ROM: Full    Dental no notable dental hx.    Pulmonary neg pulmonary ROS,  breath sounds clear to auscultation  Pulmonary exam normal       Cardiovascular negative cardio ROS  Rhythm:Regular Rate:Normal     Neuro/Psych negative neurological ROS  negative psych ROS   GI/Hepatic Neg liver ROS, GERD-  Medicated and Controlled,  Endo/Other  negative endocrine ROS  Renal/GU negative Renal ROS  negative genitourinary   Musculoskeletal negative musculoskeletal ROS (+)   Abdominal   Peds negative pediatric ROS (+)  Hematology negative hematology ROS (+)   Anesthesia Other Findings   Reproductive/Obstetrics (+) Pregnancy                           Anesthesia Physical Anesthesia Plan  ASA: II  Anesthesia Plan: Spinal   Post-op Pain Management:    Induction:   Airway Management Planned: Natural Airway  Additional Equipment:   Intra-op Plan:   Post-operative Plan:   Informed Consent: I have reviewed the patients History and Physical, chart, labs and discussed the procedure including the risks, benefits and alternatives for the proposed anesthesia with the patient or authorized representative who has indicated his/her understanding and acceptance.   Dental advisory given  Plan Discussed with: CRNA  Anesthesia Plan Comments:         Anesthesia Quick Evaluation

## 2014-07-06 NOTE — Progress Notes (Signed)
Patient expressed desire for catheter removal before end of shift at 1900. Explained that we typically remove catheter 12-14 hours after surgery to ensure adequate output. Pt verbalized understanding but insisted that catheter be removed by end of shift. Collected of urine from prior 5 hours at time of removal. Pt still desired removal after explanation of inadequate output. Midwife on call ordered bolus of LR and to collect I/O throughout night. Bolus started. Midwife to follow up with patient in the morning.

## 2014-07-06 NOTE — Anesthesia Procedure Notes (Signed)
Spinal Patient location during procedure: OR Staffing Anesthesiologist: Jansel Vonstein Performed by: anesthesiologist  Preanesthetic Checklist Completed: patient identified, site marked, surgical consent, pre-op evaluation, timeout performed, IV checked, risks and benefits discussed and monitors and equipment checked Spinal Block Patient position: sitting Prep: ChloraPrep Patient monitoring: heart rate, continuous pulse ox and blood pressure Approach: right paramedian Location: L4-5 Injection technique: single-shot Needle Needle type: Sprotte  Needle gauge: 24 G Needle length: 9 cm Additional Notes Expiration date of kit checked and confirmed. Patient tolerated procedure well, without complications.     

## 2014-07-06 NOTE — Anesthesia Postprocedure Evaluation (Signed)
  Anesthesia Post-op Note  Patient: Kimberly Howe  Procedure(s) Performed: Procedure(s) (LRB): Repeat CESAREAN SECTION (N/A)  Patient Location: PACU  Anesthesia Type: Spinal  Level of Consciousness: awake and alert   Airway and Oxygen Therapy: Patient Spontanous Breathing  Post-op Pain: mild  Post-op Assessment: Post-op Vital signs reviewed, Patient's Cardiovascular Status Stable, Respiratory Function Stable, Patent Airway and No signs of Nausea or vomiting  Last Vitals:  Filed Vitals:   07/06/14 1700  BP: 137/70  Pulse: 52  Temp: 36.5 C  Resp: 18    Post-op Vital Signs: stable   Complications: No apparent anesthesia complications

## 2014-07-06 NOTE — H&P (Signed)
Kimberly Howe is a 38 y.o. female presenting for repeat cesarean section.  Maternal Medical History:  Contractions: Frequency: rare.   Perceived severity is mild.    Fetal activity: Perceived fetal activity is normal.   Last perceived fetal movement was within the past hour.    Prenatal complications: no prenatal complications Prenatal Complications - Diabetes: none.    OB History   Grav Para Term Preterm Abortions TAB SAB Ect Mult Living   Past Medical History  Diagnosis Date  . Heartburn in pregnancy    Past Surgical History  Procedure Laterality Date  . Cesarean section  03/26/06     fetal death at 14 days  . Orif acetabular fracture      Left elbow at age 67  . Wisdom tooth extraction    . Folicles retrieval     Family History: family history is not on file. Social History:  reports that she has never smoked. She has never used smokeless tobacco. She reports that she drinks alcohol. She reports that she does not use illicit drugs.   Prenatal Transfer Tool  Maternal Diabetes: No Genetic Screening: Normal Maternal Ultrasounds/Referrals: Normal Fetal Ultrasounds or other Referrals:  None Maternal Substance Abuse:  No Significant Maternal Medications:  None Significant Maternal Lab Results:  None Other Comments:  None  Review of Systems  All other systems reviewed and are negative.     Blood pressure 136/95, pulse 72, temperature 97.9 F (36.6 C), temperature source Oral, resp. rate 16, last menstrual period 10/03/2013, SpO2 98.00%. Maternal Exam:  Uterine Assessment: Contraction strength is mild.  Contraction frequency is rare.   Abdomen: Patient reports no abdominal tenderness. Surgical scars: low transverse.   Fetal presentation: vertex  Introitus: Normal vulva. Normal vagina.  Ferning test: not done.  Nitrazine test: not done. Amniotic fluid character: not assessed.  Pelvis: questionable for delivery.   Cervix: Cervix evaluated by  digital exam.     Physical Exam  Constitutional: She is oriented to person, place, and time. She appears well-developed and well-nourished.  HENT:  Head: Normocephalic and atraumatic.  Neck: Normal range of motion. Neck supple.  Cardiovascular: Normal rate, regular rhythm, normal heart sounds and intact distal pulses.   Respiratory: Effort normal and breath sounds normal.  GI: Soft. Bowel sounds are normal.  Genitourinary: Vagina normal and uterus normal.  Musculoskeletal: Normal range of motion.  Neurological: She is alert and oriented to person, place, and time. She has normal reflexes.  Skin: Skin is warm and dry.  Psychiatric: She has a normal mood and affect. Her behavior is normal. Judgment and thought content normal.    Prenatal labs: ABO, Rh: --/--/O POS (08/27 1348) Antibody: NEG (08/27 1348) Rubella:   RPR: NON REAC (08/27 1349)  HBsAg: Negative (01/30 0000)  HIV: Non-reactive (01/30 0000)  GBS:     Assessment/Plan: Term IUP for repeat cesarean section at 39 weeks. Risks of anesthesia ,infection and bleeding noted. Injury to surrounding organs with need for repair noted. Consent acknowledged and signed.    Kimberly Howe J 07/06/2014, 9:50 AM

## 2014-07-06 NOTE — Op Note (Signed)
Cesarean Section Procedure Note  Indications: Previous Cesarean Section  Pre-operative Diagnosis: 39 week 3 day pregnancy.  Post-operative Diagnosis: same  Surgeon: Lenoard Aden   Assistants: Montez Hageman, CNM  Anesthesia: Local anesthesia 0.25.% bupivacaine and Spinal anesthesia  ASA Class: 2  Procedure Details  The patient was seen in the Holding Room. The risks, benefits, complications, treatment options, and expected outcomes were discussed with the patient.  The patient concurred with the proposed plan, giving informed consent. The risks of anesthesia, infection, bleeding and possible injury to other organs discussed. Injury to bowel, bladder, or ureter with possible need for repair discussed. Possible need for transfusion with secondary risks of hepatitis or HIV acquisition discussed. Post operative complications to include but not limited to DVT, PE and Pneumonia noted. The site of surgery properly noted/marked. The patient was taken to Operating Room # 9, identified as [name] and the procedure verified as C-Section Delivery. A Time Out was held and the above information confirmed.  After induction of anesthesia, the patient was draped and prepped in the usual sterile manner. A Pfannenstiel incision was made and carried down through the subcutaneous tissue to the fascia. Fascial incision was made and extended transversely using Mayo scissors. The fascia was separated from the underlying rectus tissue superiorly and inferiorly. The peritoneum was identified and entered. Peritoneal incision was extended longitudinally. The utero-vesical peritoneal reflection was incised transversely and the bladder flap was bluntly freed from the lower uterine segment. A low transverse uterine incision(Kerr hysterotomy) was made. Delivered from OA presentation was a  female with Apgar scores of 9 at one minute and 9 at five minutes. Bulb suctioning gently performed. Neonatal team in  attendance.After the umbilical cord was clamped and cut cord blood was obtained for evaluation. The placenta was removed intact and appeared normal. The uterus was curetted with a dry lap pack. Good hemostasis was noted.The uterine outline, tubes and ovaries appeared normal. The uterine incision was closed with running locked sutures of 0 Monocryl x 2 layers. Hemostasis was observed. Lavage was carried out until clear.The parietal peritoneum was closed with a running 2-0 Monocryl suture. The fascia was then reapproximated with running sutures of 0 Monocryl. The skin was reapproximated with 3-0 monocryl after Waldron closure with 2-0 plain suture.  Instrument, sponge, and needle counts were correct prior the abdominal closure and at the conclusion of the case.   Findings: As noted  Estimated Blood Loss:  300 mL         Drains: foley                 Specimens: placenta                 Complications:  None; patient tolerated the procedure well.         Disposition: PACU - hemodynamically stable.         Condition: stable  Attending Attestation: I performed the procedure.

## 2014-07-06 NOTE — Lactation Note (Signed)
This note was copied from the chart of Kimberly Howe. Lactation Consultation Note  Patient Name: Kimberly Howe [id number] Date: 07/06/2014 Reason for consult: Initial assessment of this first-time breastfeeding mom and her newborn, now 85 hours old and having just latched "about 5 minutes prior" to [facility] arrival.  Mom is sitting upright and has baby closely positioned and latched to her right breast and reports feeling strong tugs and no nipple pain.  LC observed rhythmical sucking bursts and intermittent swallows and encouraged mom to continue cue feedings and frequent STS.  FOB and mom's sister are both present.  Mom was shown hand expression by her nurse at 1230 feeding. This is baby's 4th feeding and previous feedings have been 10-30 minutes with LATCH score=8 at 2 feedings, per RN assessment.  Mom encouraged to feed baby 8-12 times/24 hours and with feeding cues. LC encouraged review of Baby and Me pp 9, 14 and 20-25 for STS and BF information. LC provided [employer] brochure and reviewed [facility] services and list of community and web site resources.    Maternal Data Formula Feeding for Exclusion: No Has patient been taught Hand Expression?: Yes (per RN at 1230 feeding) Does the patient have breastfeeding experience prior to this delivery?: No (first baby born 8 years ago at 33 weeks and died after 2 weeks)  Feeding Feeding Type: Breast Fed Length of feed: 10 min  LATCH Score/Interventions Latch: Grasps breast easily, tongue down, lips flanged, rhythmical sucking.  Audible Swallowing: None Intervention(s): Skin to skin  Type of Nipple: Everted at rest and after stimulation  Comfort (Breast/Nipple): Soft / non-tender     Hold (Positioning): No assistance needed to correctly position infant at breast.  LATCH Score: 8 (previously assessed LATCH score, per RN) LC observed baby well latched at this visit  Lactation Tools Discussed/Used   STS, cue feedings, hand  expression  Consult Status Consult Status: Follow-up Date: 07/07/14 Follow-up type: In-patient    Warrick Parisian [facility] 07/06/2014, 5:43 PM

## 2014-07-07 LAB — CBC
HEMATOCRIT: 32 % — AB (ref 36.0–46.0)
Hemoglobin: 11 g/dL — ABNORMAL LOW (ref 12.0–15.0)
MCH: 28.3 pg (ref 26.0–34.0)
MCHC: 34.4 g/dL (ref 30.0–36.0)
MCV: 82.3 fL (ref 78.0–100.0)
Platelets: 169 10*3/uL (ref 150–400)
RBC: 3.89 MIL/uL (ref 3.87–5.11)
RDW: 13.2 % (ref 11.5–15.5)
WBC: 10.3 10*3/uL (ref 4.0–10.5)

## 2014-07-07 MED ORDER — ACETAMINOPHEN 325 MG PO TABS
650.0000 mg | ORAL_TABLET | Freq: Four times a day (QID) | ORAL | Status: DC | PRN
  Administered 2014-07-08: 650 mg via ORAL
  Filled 2014-07-07: qty 2

## 2014-07-07 MED ORDER — IBUPROFEN 800 MG PO TABS
800.0000 mg | ORAL_TABLET | Freq: Three times a day (TID) | ORAL | Status: DC
  Administered 2014-07-07 – 2014-07-09 (×6): 800 mg via ORAL
  Filled 2014-07-07 (×6): qty 1

## 2014-07-07 MED ORDER — OXYCODONE-ACETAMINOPHEN 5-325 MG PO TABS
1.0000 | ORAL_TABLET | ORAL | Status: DC | PRN
  Administered 2014-07-07 – 2014-07-09 (×10): 1 via ORAL
  Filled 2014-07-07 (×10): qty 1

## 2014-07-07 NOTE — Progress Notes (Signed)
POSTOPERATIVE DAY # 1 S/P CS-repeat  S:         Reports feeling well - wants early DC             Tolerating po intake / no nausea / no vomiting / no flatus / no BM             Bleeding is light             Pain controlled with motrin only             Up ad lib / ambulatory/ voiding QS  Newborn breast feeding   O:  VS: BP 129/77  Pulse 54  Temp(Src) 97.7 F (36.5 C) (Oral)  Resp 18  Wt 84.369 kg (186 lb)  SpO2 100%  LMP 10/03/2013  Breastfeeding? Unknown   LABS:               Recent Labs  07/04/14 1349 07/07/14 0557  WBC 10.2 10.3  HGB 13.1 11.0*  PLT 169 169               Bloodtype: --/--/O POS (08/27 1348)  Rubella:   Immune                                        I&O:  + net 1900             Physical Exam:             Alert and Oriented X3  Lungs: Clear and unlabored  Heart: regular rate and rhythm / no mumurs  Abdomen: soft, non-tender, non-distended with hypoactive BS this AM             Fundus: firm, non-tender, Ueven             Dressing intact honeycomb              Incision:  approximated with sutures / no erythema / no ecchymosis / no drainage  Perineum: intact  Lochia: light  Extremities: 1+ dependent edema, no calf pain or tenderness, negative Homans  A:        POD # 1 S/P cesarean section             P:        Routine postoperative care              Anticipate DC tomorrow - follow-up appointment with DR Billy Coast 2 WEEKS postpartum - relocation to [facility]             Breast pump - office to check on pump status form [employer]   Marlinda Mike CNM, MSN, FACNM 07/07/2014, 9:06 AM

## 2014-07-07 NOTE — Addendum Note (Signed)
Addendum created 07/07/14 0272 by Angela Adam, CRNA   Modules edited: Notes Section   Notes Section:  File: [id number]

## 2014-07-07 NOTE — Anesthesia Postprocedure Evaluation (Signed)
  Anesthesia Post-op Note  Patient: Kimberly Howe  Procedure(s) Performed: Procedure(s) with comments: Repeat CESAREAN SECTION (N/A) - EDD: 07/12/14  Patient Location: Mother/Baby  Anesthesia Type:Spinal  Level of Consciousness: awake and alert   Airway and Oxygen Therapy: Patient Spontanous Breathing  Post-op Pain: mild  Post-op Assessment: Post-op Vital signs reviewed, Patient's Cardiovascular Status Stable, Respiratory Function Stable, No signs of Nausea or vomiting, Pain level controlled, No headache, No residual numbness and No residual motor weakness  Post-op Vital Signs: Reviewed  Last Vitals:  Filed Vitals:   07/07/14 0653  BP: 129/77  Pulse: 54  Temp: 36.5 C  Resp: 18    Complications: No apparent anesthesia complications

## 2014-07-07 NOTE — Lactation Note (Signed)
This note was copied from the chart of Kimberly Patty Leitzke. Lactation Consultation Note  Patient Name: Kimberly Howe [id number] Date: 07/07/2014 Reason for consult: Follow-up assessment of this primipara and her newborn, now 17 hours of age.  RN caring for this mom asks about providing comfort gelpads for slightly sore nipples.  RN will provide with instructions.   Maternal Data    Feeding Feeding Type: Breast Fed Length of feed: 10 min  LATCH Score/Interventions Latch: Grasps breast easily, tongue down, lips flanged, rhythmical sucking.  Audible Swallowing: A few with stimulation Intervention(s): Hand expression  Type of Nipple: Everted at rest and after stimulation  Comfort (Breast/Nipple): Filling, red/small blisters or bruises, mild/mod discomfort  Problem noted: Mild/Moderate discomfort Interventions (Mild/moderate discomfort): Comfort gels  Hold (Positioning): No assistance needed to correctly position infant at breast.  LATCH Score: 8 (recent LATCH score) - baby is exclusively breastfeeding with copious voids and one stool since birth  Lactation Tools Discussed/Used Tools: Comfort gels   Consult Status Consult Status: Follow-up Date: 07/08/14 Follow-up type: In-patient    Warrick Parisian [facility] 07/07/2014, 9:18 PM

## 2014-07-08 ENCOUNTER — Encounter (HOSPITAL_COMMUNITY): Payer: Self-pay | Admitting: Obstetrics and Gynecology

## 2014-07-08 NOTE — Lactation Note (Signed)
This note was copied from the chart of Kimberly Howe. Lactation Consultation Note Follow up visit at 53 hours of age.  Mom reports just finishing breastfeeding on one side and trying the left breast now.  Mom is observed to be standing with baby in cradle hold.  Enocuraged mom to move to bed where she is more comfortable after baby slips off the breast.  Mom reports initial pain for a few seconds with latch on, but then denies pain with feeding.   Mom is able to self latch baby in cradle hold.  Baby has wide open mouth with flanged lips and rhythmic sucking few swallows observed, mom reports hearing several with feedings.  Although mom did well with latching in cradle encouraged cross cradle if baby has a hard time keeping deep latch with flanged lips as mom reports with previous feedings.  Mom continues to wear comfort gels.  Mom will be moving out of town on Friday to [place] area and has already contacted community lactation resources in that area.   Family at bedside supportive.  Mom to call for assist as needed.      Patient Name: Kimberly Howe [id number] Date: 07/08/2014 Reason for consult: Follow-up assessment;Breast/nipple pain   Maternal Data    Feeding Feeding Type: Breast Fed Length of feed:  (observed 10 minutes)  LATCH Score/Interventions Latch: Grasps breast easily, tongue down, lips flanged, rhythmical sucking.  Audible Swallowing: Spontaneous and intermittent (per mom ) Intervention(s): Skin to skin;Hand expression  Type of Nipple: Everted at rest and after stimulation  Comfort (Breast/Nipple): Filling, red/small blisters or bruises, mild/mod discomfort     Hold (Positioning): No assistance needed to correctly position infant at breast.  LATCH Score: 9  Lactation Tools Discussed/Used Tools: Comfort gels   Consult Status Consult Status: Follow-up Date: 07/09/14 Follow-up type: In-patient    Damarco Keysor, Arvella Merles 07/08/2014, 4:32 PM

## 2014-07-08 NOTE — Progress Notes (Signed)
POD # 2  Subjective: Pt reports feeling ok-/ Pain not well controlled with Motrin, just started taking Percocet this am Tolerating po/Voiding without problems/ No n/v/ Flatus present Activity: ad lib Bleeding is light Newborn info:  Information for the patient's newborn:  Mirca, Yale Girl Takeya [[id number]]  female Feeding: breast   Objective: VS:  Filed Vitals:   07/07/14 0653 07/07/14 1051 07/07/14 1848 07/08/14 0417  BP: 129/77 115/69 147/88 132/77  Pulse: 54 53 58 50  Temp: 97.7 F (36.5 C) 97.7 F (36.5 C) 97.8 F (36.6 C) 97.1 F (36.2 C)  TempSrc: Oral Oral Oral Oral  Resp: Weight:      SpO2: 100% 98%      I&O: Intake/Output     08/30 0701 - 08/31 0700 08/31 0701 - 09/01 0700   I.V. (mL/kg)     Total Intake(mL/kg)     Urine (mL/kg/hr)     Blood     Total Output       Net              LABS:  Recent Labs  07/07/14 0557  WBC 10.3  HGB 11.0*  HCT 32.0*  PLT 169    Blood type: --/--/O POS (08/27 1348) Rubella:   IMMUNE    Physical Exam:  General: alert and cooperative CV: Regular rate and rhythm Resp: CTA bilaterally Abdomen: soft, nontender, normal bowel sounds Uterine Fundus: firm, below umbilicus, nontender Incision: Covered with Tegaderm and honeycomb dressing; no significant drainage, edema, bruising, or erythema; well approximated with suture Lochia: minimal Ext: edema 1+ BLE and Homans sign is negative, no sign of DVT    Assessment/: POD # 2/ [id number]/ S/P C/Section d/t repeat Uncontrolled pain Doing well  Plan: More consistent use of Percocet to control pain, consider alternative if not effective Continue routine post op orders Anticipate discharge home tomorrow Pt to secure Ob/Gyn provider in [place] today or tomorrow Follow-up in office this Thursday prior to move  Signed: Donette Larry, N, MSN, CNM 07/08/2014, 10:51 AM

## 2014-07-09 MED ORDER — BISACODYL 10 MG RE SUPP
10.0000 mg | Freq: Once | RECTAL | Status: AC
  Administered 2014-07-09: 10 mg via RECTAL
  Filled 2014-07-09: qty 1

## 2014-07-09 MED ORDER — OXYCODONE-ACETAMINOPHEN 5-325 MG PO TABS: 2.0000 | ORAL_TABLET | ORAL | Status: AC | PRN

## 2014-07-09 MED ORDER — OXYCODONE-ACETAMINOPHEN 5-325 MG PO TABS
2.0000 | ORAL_TABLET | ORAL | Status: DC | PRN
  Filled 2014-07-09: qty 2

## 2014-07-09 MED ORDER — IBUPROFEN 800 MG PO TABS: 800.0000 mg | ORAL_TABLET | Freq: Three times a day (TID) | ORAL | Status: AC

## 2014-07-09 NOTE — Discharge Instructions (Signed)

## 2014-07-09 NOTE — Lactation Note (Signed)
This note was copied from the chart of Kimberly Tracie Dore. Lactation Consultation Note  Upon entering the room, mother was standing breastfeeding walking around the room, both lips flanged. Reviewed engorgement care and encouraged deep latch to prevent soreness such as football hold. Mother has comfort gels and nipples are pink and tender.  Explained when standing without lots of head support, baby can be pulling on nipples, adding to discomfort. Mother plans to follow up in [place] with lactation when she moves.  Patient Name: Kimberly Howe [id number] Date: 07/09/2014 Reason for consult: Follow-up assessment   Maternal Data    Feeding Feeding Type: Breast Fed Length of feed: 18 min  LATCH Score/Interventions Latch: Grasps breast easily, tongue down, lips flanged, rhythmical sucking.  Audible Swallowing: Spontaneous and intermittent  Type of Nipple: Everted at rest and after stimulation  Comfort (Breast/Nipple): Filling, red/small blisters or bruises, mild/mod discomfort  Problem noted: Mild/Moderate discomfort Interventions (Mild/moderate discomfort): Hand expression;Comfort gels  Hold (Positioning): No assistance needed to correctly position infant at breast.  LATCH Score: 9  Lactation Tools Discussed/Used     Consult Status      Dahlia Byes [facility] 07/09/2014, 9:29 AM

## 2014-07-09 NOTE — Discharge Summary (Signed)
POSTOPERATIVE DISCHARGE SUMMARY:  Patient ID: Kimberly Howe MRN: [id number] DOB/AGE: 1975-12-08 38 y.o.  Admit date: 07/06/2014 Admission Diagnoses: Repeat Cesarean Delivery   Discharge date:  07/09/2014 Discharge Diagnoses: S/P Repeat C/S due to Repeat on 07/06/2014        Prenatal history: [id number]   EDC : 07/10/2014, by Last Menstrual Period  Has received prenatal care at [facility] since [redacted] wks gestation. Primary provider : Dr. Billy Coast Prenatal course complicated by Previous cesarean delivery, H/O PPROM, H/O PTD, H/O neonatal death (58 days old)  Prenatal Labs: ABO, Rh: O POS (08/27 1348)  Antibody: NEG (08/27 1348) Rubella:  Immune   RPR: NON REAC (08/27 1349)  HBsAg: Negative (01/30 0000)  HIV: Non-reactive (01/30 0000)  GTT : 70 GBS:  Negative  Medical / Surgical History :  Past medical history:  Past Medical History  Diagnosis Date  . Heartburn in pregnancy     Past surgical history:  Past Surgical History  Procedure Laterality Date  . Cesarean section  04/06/06     fetal death at 14 days  . Orif acetabular fracture      Left elbow at age 73  . Wisdom tooth extraction    . Folicles retrieval    . Cesarean section N/A 07/06/2014    Procedure: Repeat CESAREAN SECTION;  Surgeon: Lenoard Aden, MD;  Location: [facility];  Service: Obstetrics;  Laterality: N/A;  EDD: 07/12/14     Allergies: Review of patient's allergies indicates no known allergies.   Intrapartum Course: Scheduled repeat cesarean delivery - see Operative Report for further details  Physical Exam:   VSS: Blood pressure 138/79, pulse 65, temperature 97.9 F (36.6 C), temperature source Oral, resp. rate 18, weight 84.369 kg (186 lb), last menstrual period 10/03/2013, SpO2 98.00%, unknown if currently breastfeeding.  LABS:  Recent Labs  07/07/14 0557  WBC 10.3  HGB 11.0*  PLT 169    Newborn Data Live born female on 07/06/2014 Birth Weight: 7 lb 3.7 oz (3280 g) APGAR: 9,  9  See operative report for further details  Home with mother.  Discharge Instructions:  Wound Care: keep clean and dry / remove honeycomb POD 5 Postpartum Instructions: [facility] discharge booklet - instructions reviewed Medications:    Medication List         esomeprazole 40 MG capsule  Commonly known as:  NEXIUM  Take 40 mg by mouth daily.     ibuprofen 800 MG tablet  Commonly known as:  ADVIL,MOTRIN  Take 1 tablet (800 mg total) by mouth 3 (three) times daily.     oxyCODONE-acetaminophen 5-325 MG per tablet  Commonly known as:  PERCOCET/ROXICET  Take 2 tablets by mouth every 4 (four) hours as needed for severe pain (moderate - severe pain).     PRENATAL MULTI +DHA PO  Take 1 capsule by mouth daily.           Follow-up Information   Follow up with Lenoard Aden, MD. Schedule an appointment as soon as possible for a visit on 07/11/2014. (Postpartum visit prior to moving out-of-state)    Specialty:  Obstetrics and Gynecology   Contact information:   [address] [place] [address] [phone]       Signed: Kenard Gower, MSN, CNM 07/09/2014, 9:28 AM

## 2014-07-09 NOTE — Progress Notes (Signed)
POD # 3  Subjective: Pt reports feeling ok-/ Pain not well controlled with Motrin, taking only 1 Percocet at a time Tolerating po/Voiding without problems/ No n/v/ Flatus present Activity: ad lib Bleeding is light Newborn info:  Information for the patient's newborn:  Sanyla, Summey Girl Manette [[id number]]  female  Feeding: breast   Objective: VS:  Filed Vitals:   07/07/14 1848 07/08/14 0417 07/08/14 1735 07/09/14 0556  BP: 147/88 132/77 133/86 138/79  Pulse: 58 50 65 65  Temp: 97.8 F (36.6 C) 97.1 F (36.2 C) 98.6 F (37 C) 97.9 F (36.6 C)  TempSrc: Oral Oral Oral Oral  Resp: Weight:      SpO2:        I&O: Intake/Output   None     LABS:   Recent Labs  07/07/14 0557  WBC 10.3  HGB 11.0*  HCT 32.0*  PLT 169    Blood type: --/--/O POS (08/27 1348) Rubella:   IMMUNE    Physical Exam:  General: alert and cooperative CV: Regular rate and rhythm Resp: CTA bilaterally Abdomen: soft, nontender, normal bowel sounds Uterine Fundus: firm, 2 FB below umbilicus, nontender Incision: Covered with Tegaderm and honeycomb dressing; no significant drainage, edema, bruising, or erythema; well approximated with suture Lochia: minimal Ext: edema 1+ BLE and Homans sign is negative, no sign of DVT    Assessment/: POD # 3 / [id number] / S/P C/Section d/t repeat Uncontrolled pain Doing well  Plan: More consistent use of Percocet (2 tabs instead of 1) to control pain, consider alternative if not effective - re-evaluate at office visit Thursday 9/3 Continue routine post op orders Anticipate discharge home tomorrow Pt to secure Ob/Gyn provider in [place] today or tomorrow Follow-up in office Thursday 9/3 prior to move - pt to schedule   Signed: Raelyn Mora, Judie Petit, MSN, CNM 07/09/2014, 9:21 AM

## 2014-09-09 ENCOUNTER — Encounter (HOSPITAL_COMMUNITY): Payer: Self-pay | Admitting: Obstetrics and Gynecology
